# Patient Record
Sex: Male | Born: 1950 | Race: White | Hispanic: No | Marital: Married | Smoking: Current every day smoker
Health system: Southern US, Community
[De-identification: ages and names within clinical notes are randomized; demographics above are authoritative.]

## PROBLEM LIST (undated history)

## (undated) DIAGNOSIS — I639 Cerebral infarction, unspecified: Secondary | ICD-10-CM

## (undated) DIAGNOSIS — D65 Disseminated intravascular coagulation [defibrination syndrome]: Secondary | ICD-10-CM

## (undated) DIAGNOSIS — E119 Type 2 diabetes mellitus without complications: Secondary | ICD-10-CM

## (undated) HISTORY — PX: LEG AMPUTATION BELOW KNEE: SHX694

---

## 2005-03-16 ENCOUNTER — Encounter: Admission: RE | Admit: 2005-03-16 | Discharge: 2005-03-16 | Payer: Self-pay | Admitting: Family Medicine

## 2005-03-25 ENCOUNTER — Inpatient Hospital Stay (HOSPITAL_COMMUNITY): Admission: EM | Admit: 2005-03-25 | Discharge: 2005-03-30 | Payer: Self-pay | Admitting: Emergency Medicine

## 2005-03-25 ENCOUNTER — Ambulatory Visit: Payer: Self-pay | Admitting: Infectious Diseases

## 2005-03-25 ENCOUNTER — Ambulatory Visit: Payer: Self-pay | Admitting: Pulmonary Disease

## 2005-08-22 ENCOUNTER — Encounter: Admission: RE | Admit: 2005-08-22 | Discharge: 2005-09-04 | Payer: Self-pay | Admitting: Orthopaedic Surgery

## 2005-10-16 ENCOUNTER — Encounter: Admission: RE | Admit: 2005-10-16 | Discharge: 2006-01-14 | Payer: Self-pay | Admitting: Orthopaedic Surgery

## 2006-01-15 ENCOUNTER — Encounter: Admission: RE | Admit: 2006-01-15 | Discharge: 2006-04-15 | Payer: Self-pay | Admitting: Orthopaedic Surgery

## 2006-04-16 ENCOUNTER — Encounter: Admission: RE | Admit: 2006-04-16 | Discharge: 2006-07-15 | Payer: Self-pay | Admitting: Orthopaedic Surgery

## 2006-05-09 ENCOUNTER — Encounter: Admission: RE | Admit: 2006-05-09 | Discharge: 2006-05-09 | Payer: Self-pay | Admitting: Family Medicine

## 2006-07-15 ENCOUNTER — Ambulatory Visit: Payer: Self-pay | Admitting: Family Medicine

## 2016-04-18 ENCOUNTER — Encounter (HOSPITAL_COMMUNITY): Payer: Self-pay

## 2016-04-18 ENCOUNTER — Emergency Department (HOSPITAL_COMMUNITY): Payer: BLUE CROSS/BLUE SHIELD

## 2016-04-18 ENCOUNTER — Inpatient Hospital Stay (HOSPITAL_COMMUNITY)
Admission: EM | Admit: 2016-04-18 | Discharge: 2016-04-19 | DRG: 072 | Disposition: A | Discharge status: Home / Self Care | Payer: BLUE CROSS/BLUE SHIELD | Attending: Internal Medicine | Admitting: Internal Medicine

## 2016-04-18 DIAGNOSIS — Z8661 Personal history of infections of the central nervous system: Secondary | ICD-10-CM | POA: Diagnosis not present

## 2016-04-18 DIAGNOSIS — E876 Hypokalemia: Secondary | ICD-10-CM

## 2016-04-18 DIAGNOSIS — Z89611 Acquired absence of right leg above knee: Secondary | ICD-10-CM | POA: Diagnosis not present

## 2016-04-18 DIAGNOSIS — Z89512 Acquired absence of left leg below knee: Secondary | ICD-10-CM | POA: Diagnosis not present

## 2016-04-18 DIAGNOSIS — Z7901 Long term (current) use of anticoagulants: Secondary | ICD-10-CM

## 2016-04-18 DIAGNOSIS — F329 Major depressive disorder, single episode, unspecified: Secondary | ICD-10-CM | POA: Diagnosis present

## 2016-04-18 DIAGNOSIS — Z7982 Long term (current) use of aspirin: Secondary | ICD-10-CM

## 2016-04-18 DIAGNOSIS — I10 Essential (primary) hypertension: Secondary | ICD-10-CM | POA: Diagnosis present

## 2016-04-18 DIAGNOSIS — E1165 Type 2 diabetes mellitus with hyperglycemia: Secondary | ICD-10-CM | POA: Diagnosis present

## 2016-04-18 DIAGNOSIS — Z9081 Acquired absence of spleen: Secondary | ICD-10-CM

## 2016-04-18 DIAGNOSIS — Z7984 Long term (current) use of oral hypoglycemic drugs: Secondary | ICD-10-CM | POA: Diagnosis not present

## 2016-04-18 DIAGNOSIS — F17200 Nicotine dependence, unspecified, uncomplicated: Secondary | ICD-10-CM | POA: Diagnosis present

## 2016-04-18 DIAGNOSIS — G9340 Encephalopathy, unspecified: Secondary | ICD-10-CM | POA: Diagnosis present

## 2016-04-18 DIAGNOSIS — Z8673 Personal history of transient ischemic attack (TIA), and cerebral infarction without residual deficits: Secondary | ICD-10-CM | POA: Diagnosis not present

## 2016-04-18 DIAGNOSIS — D72829 Elevated white blood cell count, unspecified: Secondary | ICD-10-CM | POA: Diagnosis present

## 2016-04-18 DIAGNOSIS — R4182 Altered mental status, unspecified: Secondary | ICD-10-CM | POA: Diagnosis present

## 2016-04-18 DIAGNOSIS — I480 Paroxysmal atrial fibrillation: Secondary | ICD-10-CM | POA: Diagnosis present

## 2016-04-18 HISTORY — DX: Type 2 diabetes mellitus without complications: E11.9

## 2016-04-18 HISTORY — DX: Cerebral infarction, unspecified: I63.9

## 2016-04-18 HISTORY — DX: Disseminated intravascular coagulation (defibrination syndrome): D65

## 2016-04-18 LAB — CBC WITH DIFFERENTIAL/PLATELET
BASOS ABS: 0.1 10*3/uL (ref 0.0–0.1)
Basophils Relative: 0 %
Eosinophils Absolute: 0 10*3/uL (ref 0.0–0.7)
Eosinophils Relative: 0 %
HEMATOCRIT: 47.9 % (ref 39.0–52.0)
HEMOGLOBIN: 16.8 g/dL (ref 13.0–17.0)
LYMPHS PCT: 8 %
Lymphs Abs: 1.7 10*3/uL (ref 0.7–4.0)
MCH: 30.9 pg (ref 26.0–34.0)
MCHC: 35.1 g/dL (ref 30.0–36.0)
MCV: 88.1 fL (ref 78.0–100.0)
Monocytes Absolute: 1.4 10*3/uL — ABNORMAL HIGH (ref 0.1–1.0)
Monocytes Relative: 6 %
NEUTROS ABS: 19.1 10*3/uL — AB (ref 1.7–7.7)
NEUTROS PCT: 86 %
Platelets: 499 10*3/uL — ABNORMAL HIGH (ref 150–400)
RBC: 5.44 MIL/uL (ref 4.22–5.81)
RDW: 14.7 % (ref 11.5–15.5)
WBC: 22.2 10*3/uL — AB (ref 4.0–10.5)

## 2016-04-18 LAB — CBG MONITORING, ED: Glucose-Capillary: 230 mg/dL — ABNORMAL HIGH (ref 65–99)

## 2016-04-18 LAB — TSH: TSH: 1.191 u[IU]/mL (ref 0.350–4.500)

## 2016-04-18 LAB — URINALYSIS, ROUTINE W REFLEX MICROSCOPIC
Glucose, UA: 250 mg/dL — AB
KETONES UR: 15 mg/dL — AB
LEUKOCYTES UA: NEGATIVE
NITRITE: NEGATIVE
PH: 5 (ref 5.0–8.0)
Protein, ur: >300 mg/dL — AB

## 2016-04-18 LAB — GLUCOSE, CAPILLARY: Glucose-Capillary: 179 mg/dL — ABNORMAL HIGH (ref 65–99)

## 2016-04-18 LAB — LACTIC ACID, PLASMA: Lactic Acid, Venous: 1.6 mmol/L (ref 0.5–2.0)

## 2016-04-18 LAB — URINE MICROSCOPIC-ADD ON

## 2016-04-18 LAB — BASIC METABOLIC PANEL
ANION GAP: 12 (ref 5–15)
BUN: 15 mg/dL (ref 6–20)
CO2: 22 mmol/L (ref 22–32)
Calcium: 9 mg/dL (ref 8.9–10.3)
Chloride: 105 mmol/L (ref 101–111)
Creatinine, Ser: 0.71 mg/dL (ref 0.61–1.24)
GLUCOSE: 210 mg/dL — AB (ref 65–99)
POTASSIUM: 3.2 mmol/L — AB (ref 3.5–5.1)
Sodium: 139 mmol/L (ref 135–145)

## 2016-04-18 LAB — RAPID URINE DRUG SCREEN, HOSP PERFORMED
Amphetamines: NOT DETECTED
BARBITURATES: NOT DETECTED
BENZODIAZEPINES: NOT DETECTED
COCAINE: NOT DETECTED
OPIATES: NOT DETECTED
Tetrahydrocannabinol: POSITIVE — AB

## 2016-04-18 LAB — HEPATIC FUNCTION PANEL
ALK PHOS: 76 U/L (ref 38–126)
ALT: 31 U/L (ref 17–63)
AST: 31 U/L (ref 15–41)
Albumin: 3.7 g/dL (ref 3.5–5.0)
BILIRUBIN DIRECT: 0.2 mg/dL (ref 0.1–0.5)
BILIRUBIN TOTAL: 0.9 mg/dL (ref 0.3–1.2)
Indirect Bilirubin: 0.7 mg/dL (ref 0.3–0.9)
Total Protein: 8 g/dL (ref 6.5–8.1)

## 2016-04-18 LAB — MAGNESIUM: Magnesium: 1.6 mg/dL — ABNORMAL LOW (ref 1.7–2.4)

## 2016-04-18 LAB — AMMONIA: Ammonia: 24 umol/L (ref 9–35)

## 2016-04-18 LAB — TROPONIN I: Troponin I: <0.03 ng/mL (ref <0.031)

## 2016-04-18 MED ORDER — SODIUM CHLORIDE 0.9% FLUSH
3.0000 mL | Freq: Two times a day (BID) | INTRAVENOUS | Status: DC
  Administered 2016-04-18: 3 mL via INTRAVENOUS

## 2016-04-18 MED ORDER — HALOPERIDOL LACTATE 5 MG/ML IJ SOLN: 5.0000 mg | Freq: Four times a day (QID) | INTRAMUSCULAR | Status: DC | PRN

## 2016-04-18 MED ORDER — APIXABAN 5 MG PO TABS
5.0000 mg | ORAL_TABLET | Freq: Two times a day (BID) | ORAL | Status: DC
  Administered 2016-04-18 – 2016-04-19 (×2): 5 mg via ORAL
  Filled 2016-04-18 (×2): qty 1

## 2016-04-18 MED ORDER — ACETAMINOPHEN 650 MG RE SUPP: 650.0000 mg | Freq: Four times a day (QID) | RECTAL | Status: DC | PRN

## 2016-04-18 MED ORDER — POTASSIUM CHLORIDE IN NACL 20-0.9 MEQ/L-% IV SOLN
INTRAVENOUS | Status: DC
  Administered 2016-04-18 – 2016-04-19 (×2): via INTRAVENOUS

## 2016-04-18 MED ORDER — ACETAMINOPHEN 325 MG PO TABS: 650.0000 mg | ORAL_TABLET | Freq: Four times a day (QID) | ORAL | Status: DC | PRN

## 2016-04-18 MED ORDER — INSULIN ASPART 100 UNIT/ML ~~LOC~~ SOLN
0.0000 [IU] | Freq: Three times a day (TID) | SUBCUTANEOUS | Status: DC
  Administered 2016-04-19 (×2): 1 [IU] via SUBCUTANEOUS

## 2016-04-18 MED ORDER — SODIUM CHLORIDE 0.9 % IV SOLN: INTRAVENOUS | Status: DC

## 2016-04-18 MED ORDER — ASPIRIN EC 81 MG PO TBEC
81.0000 mg | DELAYED_RELEASE_TABLET | Freq: Every day | ORAL | Status: DC
Start: 2016-04-18 — End: 2016-04-19
  Administered 2016-04-19: 81 mg via ORAL
  Filled 2016-04-18: qty 1

## 2016-04-18 MED ORDER — METOPROLOL SUCCINATE ER 50 MG PO TB24
50.0000 mg | ORAL_TABLET | Freq: Every day | ORAL | Status: DC
Start: 2016-04-19 — End: 2016-04-19
  Administered 2016-04-19: 50 mg via ORAL
  Filled 2016-04-18: qty 1

## 2016-04-18 MED ORDER — LORAZEPAM 2 MG/ML IJ SOLN
1.0000 mg | INTRAMUSCULAR | Status: DC | PRN
  Administered 2016-04-18: 1 mg via INTRAVENOUS
  Filled 2016-04-18: qty 1

## 2016-04-18 MED ORDER — INSULIN ASPART 100 UNIT/ML ~~LOC~~ SOLN: 0.0000 [IU] | Freq: Every day | SUBCUTANEOUS | Status: DC

## 2016-04-18 MED ORDER — ONDANSETRON HCL 4 MG/2ML IJ SOLN: 4.0000 mg | Freq: Four times a day (QID) | INTRAMUSCULAR | Status: DC | PRN

## 2016-04-18 MED ORDER — ONDANSETRON HCL 4 MG PO TABS: 4.0000 mg | ORAL_TABLET | Freq: Four times a day (QID) | ORAL | Status: DC | PRN
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
## 2016-04-18 NOTE — ED Notes (Signed)
Pt alert and oriented at time of arrival, but now will not answer questions. But will follow commands.
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
## 2016-04-18 NOTE — ED Notes (Signed)
Attempted to call report, nurse with another patient, will call back.

## 2016-04-18 NOTE — ED Notes (Signed)
Patienst friend also states patient has been staying in a cabin and has had lots of visitors. Patient has possibly not been sleeping and maybe drinking.

## 2016-04-18 NOTE — ED Notes (Signed)
Pt attempting to obtain urine sample. 
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
## 2016-04-18 NOTE — ED Notes (Signed)
Friend of patient here, states patient has not been hisself for 4-5 days. Not making any sense and hallucinating.

## 2016-04-18 NOTE — ED Notes (Signed)
Pt's hunting buddies here now. One stated he thinks pt may have had a seizure today. States pt was jerking all over, his eyes rolled back and the pt was foaming at the mouth. They also stated pt has not been acting right since Friday. States he has been confused also.

## 2016-04-18 NOTE — ED Notes (Signed)
Attempted to do in and out cath, unable to advance catheter.

## 2016-04-19 ENCOUNTER — Inpatient Hospital Stay (HOSPITAL_COMMUNITY)
Admit: 2016-04-19 | Discharge: 2016-04-19 | Disposition: A | Discharge status: Home / Self Care | Payer: BLUE CROSS/BLUE SHIELD | Attending: Internal Medicine | Admitting: Internal Medicine

## 2016-04-19 DIAGNOSIS — G9340 Encephalopathy, unspecified: Principal | ICD-10-CM

## 2016-04-19 DIAGNOSIS — I10 Essential (primary) hypertension: Secondary | ICD-10-CM

## 2016-04-19 LAB — GLUCOSE, CAPILLARY
GLUCOSE-CAPILLARY: 130 mg/dL — AB (ref 65–99)
Glucose-Capillary: 123 mg/dL — ABNORMAL HIGH (ref 65–99)
Glucose-Capillary: 117 mg/dL — ABNORMAL HIGH (ref 65–99)

## 2016-04-19 LAB — COMPREHENSIVE METABOLIC PANEL
ALBUMIN: 3.4 g/dL — AB (ref 3.5–5.0)
ALT: 26 U/L (ref 17–63)
AST: 26 U/L (ref 15–41)
Alkaline Phosphatase: 69 U/L (ref 38–126)
Anion gap: 11 (ref 5–15)
BUN: 17 mg/dL (ref 6–20)
CHLORIDE: 108 mmol/L (ref 101–111)
CO2: 22 mmol/L (ref 22–32)
Calcium: 8.7 mg/dL — ABNORMAL LOW (ref 8.9–10.3)
Creatinine, Ser: 0.72 mg/dL (ref 0.61–1.24)
GFR calc Af Amer: >60 mL/min (ref >60)
Glucose, Bld: 134 mg/dL — ABNORMAL HIGH (ref 65–99)
POTASSIUM: 3.2 mmol/L — AB (ref 3.5–5.1)
SODIUM: 141 mmol/L (ref 135–145)
Total Bilirubin: 1.1 mg/dL (ref 0.3–1.2)
Total Protein: 7.1 g/dL (ref 6.5–8.1)

## 2016-04-19 LAB — CBC
HEMATOCRIT: 45.4 % (ref 39.0–52.0)
Hemoglobin: 15.9 g/dL (ref 13.0–17.0)
MCH: 31 pg (ref 26.0–34.0)
MCHC: 35 g/dL (ref 30.0–36.0)
MCV: 88.5 fL (ref 78.0–100.0)
Platelets: 472 10*3/uL — ABNORMAL HIGH (ref 150–400)
RBC: 5.13 MIL/uL (ref 4.22–5.81)
RDW: 14.8 % (ref 11.5–15.5)
WBC: 17.9 10*3/uL — AB (ref 4.0–10.5)

## 2016-04-19 LAB — TROPONIN I

## 2016-04-19 LAB — VITAMIN B12: Vitamin B-12: 209 pg/mL (ref 180–914)

## 2016-04-19 MED ORDER — POTASSIUM CHLORIDE CRYS ER 20 MEQ PO TBCR
40.0000 meq | EXTENDED_RELEASE_TABLET | Freq: Once | ORAL | Status: AC
  Administered 2016-04-19: 40 meq via ORAL
  Filled 2016-04-19: qty 2

## 2016-04-19 NOTE — Progress Notes (Signed)
EEG Completed; Results Pending  

## 2016-04-19 NOTE — Progress Notes (Addendum)
Pt IV and telemetry removed, tolerated well.  Reviewed discharge instructions with pt and answered questions at this time.  Waiting for ride at this time.
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
## 2016-04-19 NOTE — Progress Notes (Signed)
Patient verbally authorized team to speak with spouse in [place].  Steva ColderSherry Garnett is wife/caregiver/POA per pt.  No POA paperwork on chart at this time  Patient also authorized to speak with Hassan Buckleronald Pryor, friend who he is visiting here in [place].

## 2016-04-20 LAB — HEMOGLOBIN A1C
HEMOGLOBIN A1C: 8.3 % — AB (ref 4.8–5.6)
Mean Plasma Glucose: 192 mg/dL

## 2016-04-20 LAB — HEPATITIS C ANTIBODY: HCV Ab: >11 s/co ratio — ABNORMAL HIGH (ref 0.0–0.9)

## 2016-04-20 LAB — RPR: RPR: NONREACTIVE

## 2016-04-20 LAB — HEPATITIS B SURFACE ANTIGEN: HEP B S AG: NEGATIVE

## 2016-04-20 LAB — HIV ANTIBODY (ROUTINE TESTING W REFLEX): HIV Screen 4th Generation wRfx: NONREACTIVE

## 2016-04-23 LAB — CULTURE, BLOOD (ROUTINE X 2)
CULTURE: NO GROWTH
Culture: NO GROWTH

## 2016-04-23 NOTE — Procedures (Signed)
  [facility] Marsha Hillman A. Gerilyn Pilgrimoonquah, MD     [email or web address]           HISTORY: The patient is a 66 year old man who presents with episodic confusion. [facility].  MEDICATIONS: Scheduled Meds: Continuous Infusions: PRN Meds:.  Prior to Admission medications   Medication Sig Start Date End Date Taking? Authorizing Provider  amLODipine (NORVASC) 5 MG tablet Take 5 mg by mouth daily.    Historical Provider, MD  apixaban (ELIQUIS) 5 MG TABS tablet Take 5 mg by mouth 2 (two) times daily.    Historical Provider, MD  aspirin EC 81 MG tablet Take 81 mg by mouth daily.    Historical Provider, MD  gabapentin (NEURONTIN) 600 MG tablet Take 600 mg by mouth at bedtime.    Historical Provider, MD  glipiZIDE (GLUCOTROL) 5 MG tablet Take 5 mg by mouth daily before breakfast.    Historical Provider, MD  ibuprofen (ADVIL,MOTRIN) 200 MG tablet Take 400 mg by mouth daily as needed for headache.    Historical Provider, MD  metFORMIN (GLUCOPHAGE) 1000 MG tablet Take 500-1,000 mg by mouth 2 (two) times daily with a meal.    Historical Provider, MD  metoprolol succinate (TOPROL-XL) 50 MG 24 hr tablet Take 50 mg by mouth daily. Take with or immediately following a meal.    Historical Provider, MD  pantoprazole (PROTONIX) 40 MG tablet Take 40 mg by mouth daily.    Historical Provider, MD  traMADol (ULTRAM) 50 MG tablet Take 50 mg by mouth every 6 (six) hours as needed. for pain 03/06/16   Historical Provider, MD      ANALYSIS: A 16 channel recording using standard 10 20 measurements is conducted for 21 minutes. There is a well-formed posterior dominant rhythm associated with low voltage and a 16 Hz. There is also beta activity observing the frontal areas. There is significant myogenic artifact seen throughout the recording especially in the frontal central leads. Photic simulation and hypoventilation are not carried out. There is no  focal or lateral slowing. There is no epileptiform activity is observed.   IMPRESSION: This is a normal recording awake and drowsy states.      Donesha Wallander A. Gerilyn Pilgrimoonquah, M.D.  Diplomate, Biomedical engineerAmerican Board of Psychiatry and Neurology ( Neurology).

## 2018-01-01 IMAGING — MR MR HEAD W/O CM
6 of 10 series · 21 of 48 positions shown · non-contrast
Comparison: CT of the head without contrast from the same day.

CLINICAL DATA: Witnessed seizure today.  Confusion.

EXAM:
MRI HEAD WITHOUT CONTRAST
TECHNIQUE: Multiplanar, multiecho pulse sequences of the brain and surrounding
structures were obtained without intravenous contrast.

[Series 5: T1 · sagittal · 5.0mm · 0.43mm/px · 2 of 21 slices shown (1 of 2)]
[im 1/21]
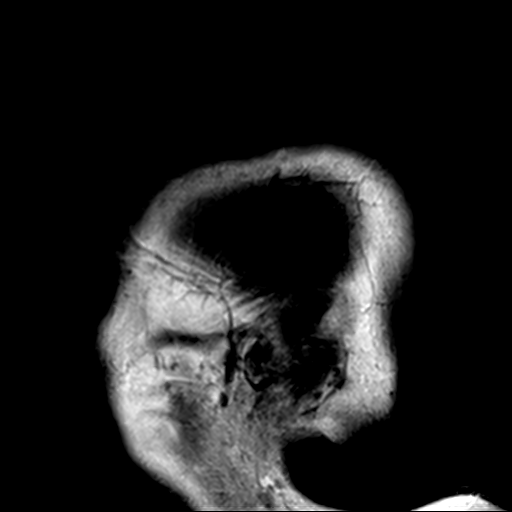
[im 21/21]
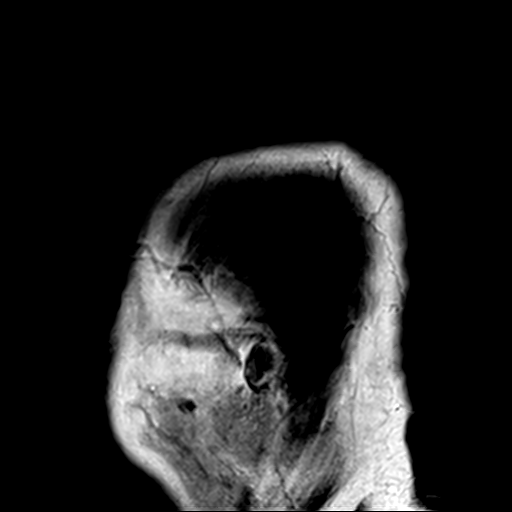

[Series 6: T2 · axial · 5.0mm · 0.51mm/px · z∈[-36,+105]mm · 3 of 23 slices shown (1 of 2)]
[im 1/23]
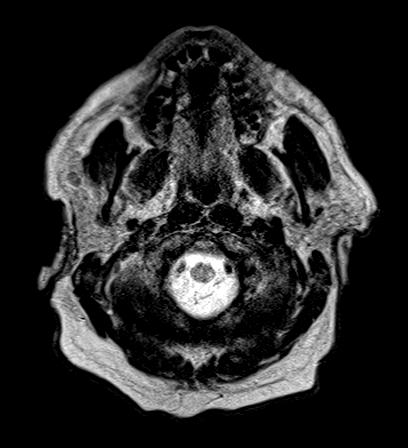
[im 12/23]
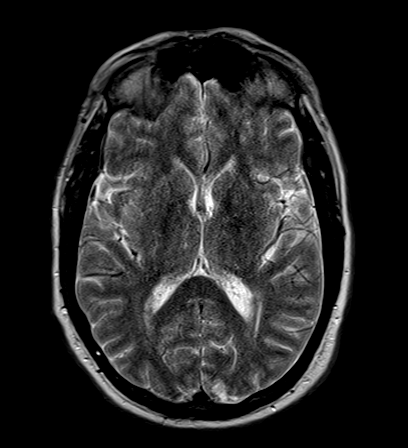
[im 23/23]
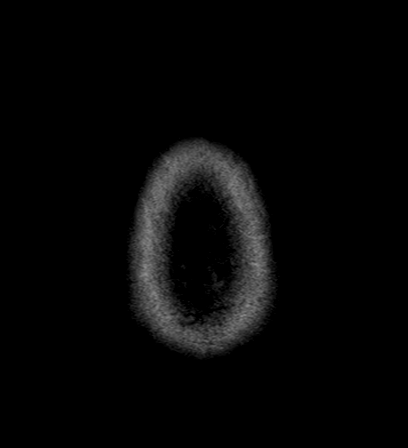

[Series 7: FLAIR · axial · 5.0mm · 0.35mm/px · z∈[-36,+104]mm · 3 of 23 slices shown]
[im 1/23]
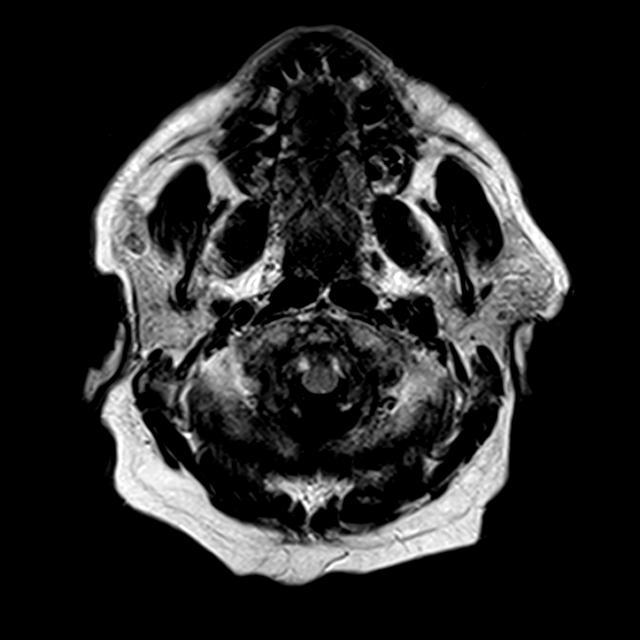
[im 12/23]
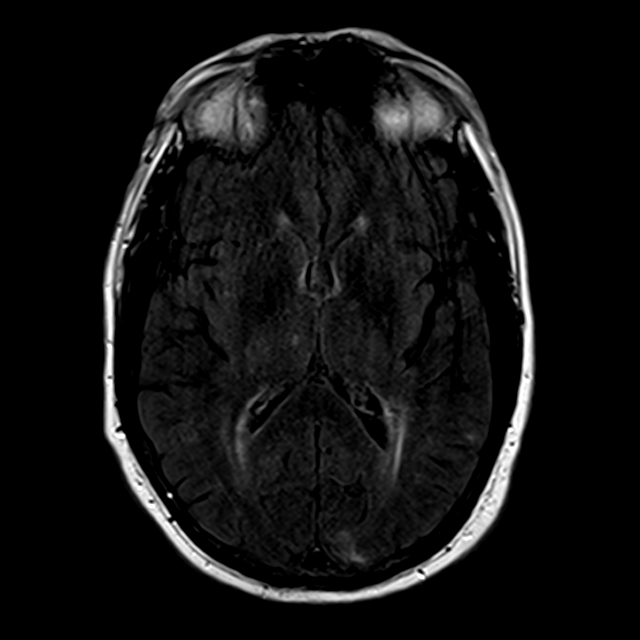
[im 23/23]
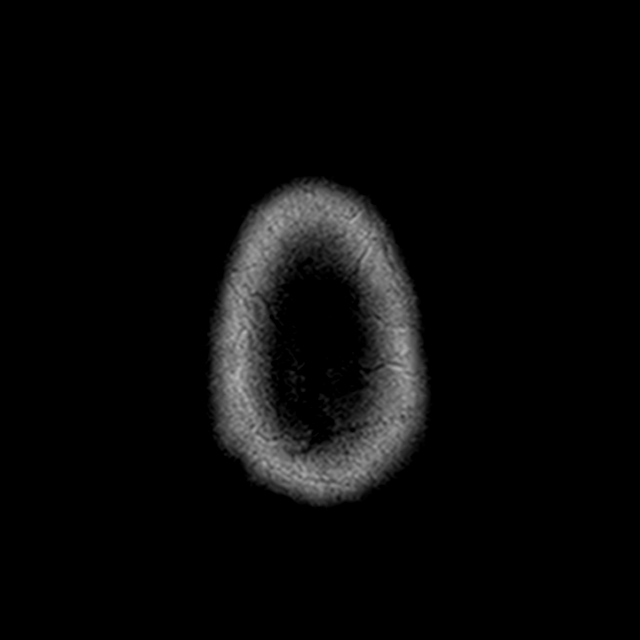

[Series 8: T1 · axial · 2.0mm · 0.44mm/px · z∈[-45,+101]mm · 8 of 75 slices shown (2 of 2)]
[im 1/75]
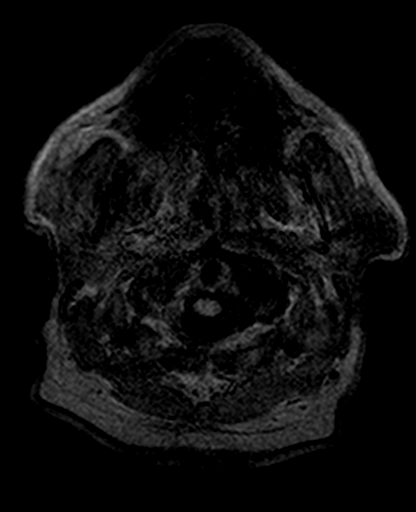
[im 10/75]
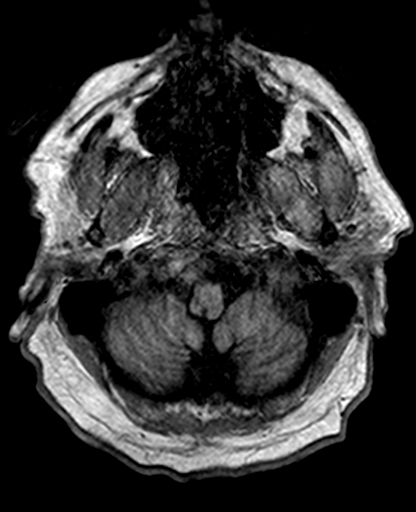
[im 19/75]
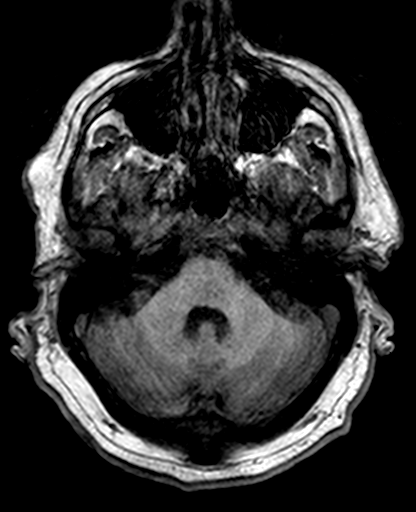
[im 28/75]
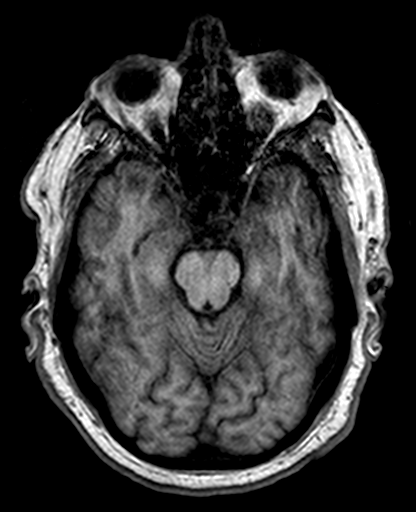
[im 47/75]
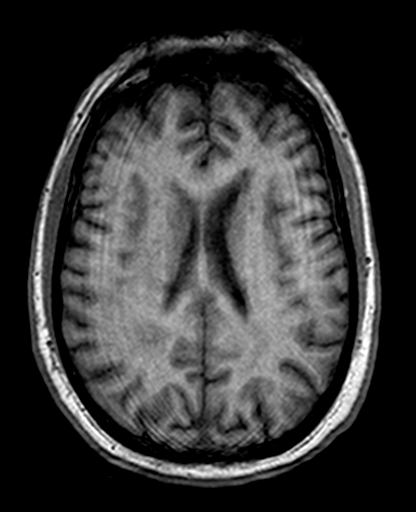
[im 56/75]
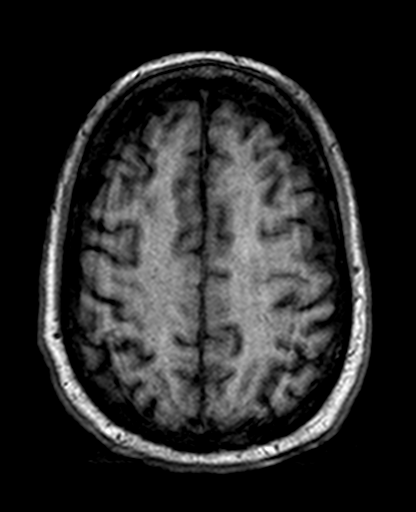
[im 65/75]
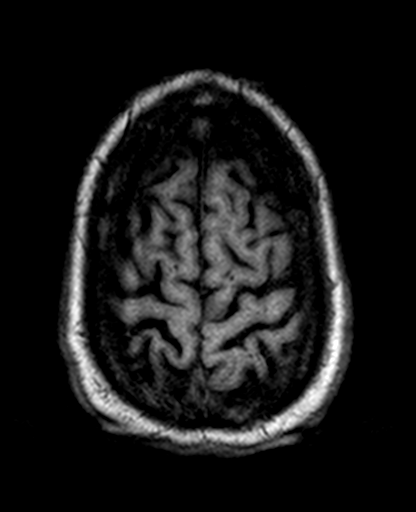
[im 75/75]
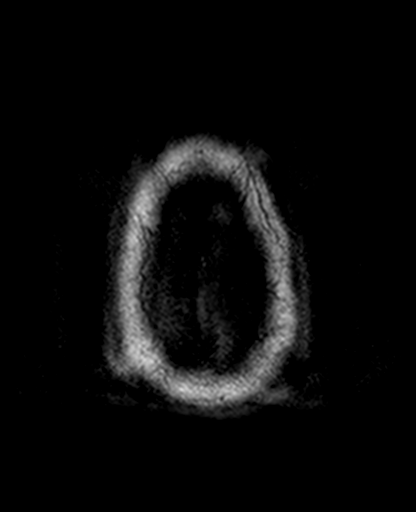

[Series 9: trauma axial · axial · 5.0mm · 0.39mm/px · 1 of 23 slices shown]
[im 1/23]
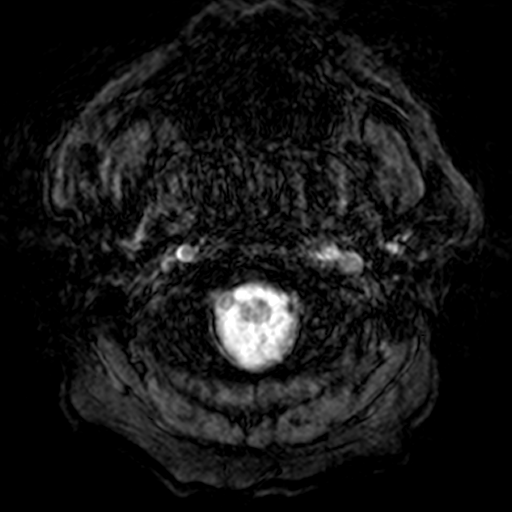

[Series 10: T2 · coronal · 5.0mm · 0.47mm/px · 4 of 30 slices shown (2 of 2)]
[im 1/30]
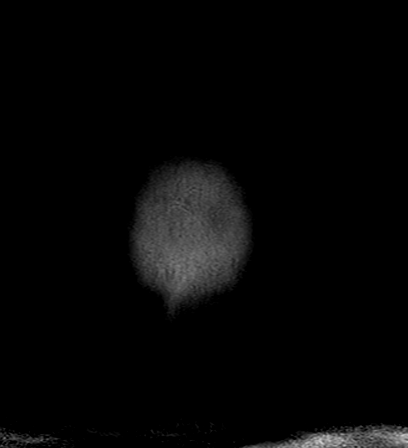
[im 10/30]
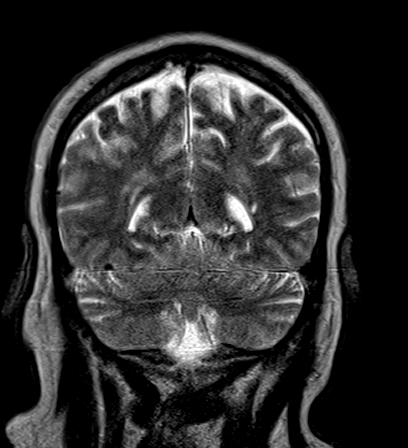
[im 20/30]
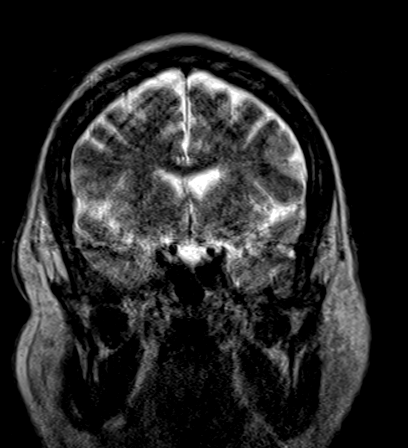
[im 30/30]
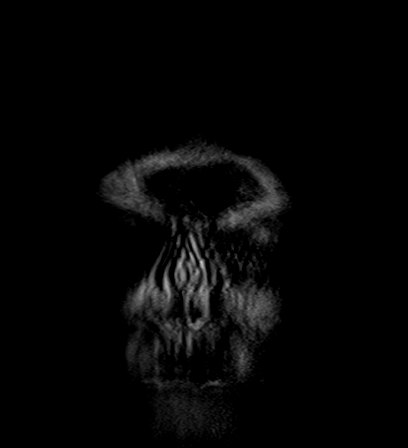

[21 of 48 positions shown; findings below may reference images not displayed]

FINDINGS: The diffusion-weighted images demonstrate no evidence for acute or
subacute infarction. For acute hemorrhage or mass lesion is present.

Moderate periventricular and subcortical T2 changes bilaterally are
advanced for age. Two separate left occipital lobe infarcts are
present, 1 medial and 1 posterior.

Study is mildly degraded by patient motion.

The internal auditory canals are within normal limits bilaterally. A
remote lacunar infarct is present in the right thalamus. A remote
lacunar infarct is present in the posterior right cerebellum.
Abnormal signal within the proximal right vertebral artery suggests
slow or occluded flow. Flow is present in the left vertebral artery,
the basilar artery and the anterior circulation. Flow is present in
the proximal branch vessels.

The skullbase is within normal limits. Midline sagittal images are
unremarkable.
IMPRESSION: 1. No acute intracranial abnormality. No evidence for acute or
subacute infarction.
2. Remote left occipital lobe infarcts.
3. Moderate diffuse white matter disease is advanced for age. This
likely reflects the sequela of chronic microvascular ischemia.
4. Remote lacunar infarcts of the right thalamus and right
cerebellum.
